# Patient Record
Sex: Female | Born: 2000 | Race: White | Hispanic: No | Marital: Married | Smoking: Never smoker
Health system: Southern US, Community
[De-identification: ages and names within clinical notes are randomized; demographics above are authoritative.]

## PROBLEM LIST (undated history)

## (undated) HISTORY — PX: ADENOIDECTOMY: SUR15

## (undated) HISTORY — PX: TONSILLECTOMY: SUR1361

---
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
## 2022-02-16 ENCOUNTER — Other Ambulatory Visit: Payer: Self-pay

## 2022-02-16 ENCOUNTER — Ambulatory Visit
Admission: EM | Admit: 2022-02-16 | Discharge: 2022-02-16 | Disposition: A | Discharge status: Home / Self Care | Payer: Medicaid Other | Attending: Emergency Medicine | Admitting: Emergency Medicine

## 2022-02-16 ENCOUNTER — Encounter: Payer: Self-pay | Admitting: Emergency Medicine

## 2022-02-16 ENCOUNTER — Ambulatory Visit: Admission: EM | Admit: 2022-02-16 | Discharge status: Absent without leave | Payer: Self-pay | Source: Home / Self Care

## 2022-02-16 DIAGNOSIS — B349 Viral infection, unspecified: Secondary | ICD-10-CM

## 2022-02-16 DIAGNOSIS — R112 Nausea with vomiting, unspecified: Secondary | ICD-10-CM

## 2022-02-16 MED ORDER — ONDANSETRON 4 MG PO TBDP: 4.0000 mg | ORAL_TABLET | Freq: Three times a day (TID) | ORAL | 0 refills | Status: DC | PRN

## 2022-02-16 NOTE — Discharge Instructions (Addendum)
Your COVID test is pending.  You should self quarantine until the test result is back.   ? ?Take Tylenol or ibuprofen as needed for fever or discomfort.   ? ?Take the antinausea medication as directed.  Keep yourself hydrated with clear liquids, such as water and Gatorade.   ? ?Follow up with your primary care provider if your symptoms are not improving.   ?

## 2022-02-16 NOTE — ED Triage Notes (Addendum)
Pt here with cough, sore throat, loss of taste and smell x 3 days.  ?

## 2022-02-16 NOTE — ED Provider Notes (Addendum)
?[facility] ? ? ? ?CSN: [id number] ?Arrival date & time: 02/16/22  1316 ? ? ?  ? ?History   ?Chief Complaint ?Chief Complaint  ?Patient presents with  ? Cough  ? Sore Throat  ? Loss of Taste and Smell  ? ? ?HPI ?Angela Johns is a 21 y.o. female.  Patient presents with 3 day history of congestion, runny nose, sore throat, cough, nausea, vomiting, diarrhea, loss of taste and smell.  Treatment at home with Mucinex.  She denies fever, rash, shortness of breath, or other symptoms.  No pertinent medical history.  ? ?The history is provided by the patient.  ? ?History reviewed. No pertinent past medical history. ? ?There are no problems to display for this patient. ? ? ?Past Surgical History:  ?Procedure Laterality Date  ? ADENOIDECTOMY    ? TONSILLECTOMY    ? ? ?OB History   ?No obstetric history on file. ?  ? ? ? ?Home Medications   ? ?Prior to Admission medications   ?Medication Sig Start Date End Date Taking? Authorizing Provider  ?ondansetron (ZOFRAN-ODT) 4 MG disintegrating tablet Take 1 tablet (4 mg total) by mouth every 8 (eight) hours as needed for nausea or vomiting. 02/16/22  Yes Mickie Bail, NP  ? ? ?Family History ?History reviewed. No pertinent family history. ? ?Social History ?Social History  ? ?Tobacco Use  ? Smoking status: Never  ? ? ? ?Allergies   ?Patient has no known allergies. ? ? ?Review of Systems ?Review of Systems  ?Constitutional:  Negative for chills and fever.  ?HENT:  Positive for congestion, rhinorrhea and sore throat. Negative for ear pain.   ?Respiratory:  Positive for cough. Negative for shortness of breath.   ?Cardiovascular:  Negative for chest pain and palpitations.  ?Gastrointestinal:  Positive for diarrhea, nausea and vomiting. Negative for abdominal pain.  ?Skin:  Negative for color change and rash.  ?All other systems reviewed and are negative. ? ? ?Physical Exam ?Triage Vital Signs ?ED Triage Vitals  ?Enc Vitals Group  ?   BP   ?   Pulse   ?   Resp   ?   Temp   ?    Temp src   ?   SpO2   ?   Weight   ?   Height   ?   Head Circumference   ?   Peak Flow   ?   Pain Score   ?   Pain Loc   ?   Pain Edu?   ?   Excl. in GC?   ? ?No data found. ? ?Updated Vital Signs ?BP 124/85   Pulse (!) 109   Temp 98.8 ?F (37.1 ?C)   Resp 18   LMP 02/14/2022 (Approximate)   SpO2 97%  ? ?Visual Acuity ?Right Eye Distance:   ?Left Eye Distance:   ?Bilateral Distance:   ? ?Right Eye Near:   ?Left Eye Near:    ?Bilateral Near:    ? ?Physical Exam ?Vitals and nursing note reviewed.  ?Constitutional:   ?   General: She is not in acute distress. ?   Appearance: She is well-developed.  ?HENT:  ?   Right Ear: Tympanic membrane normal.  ?   Left Ear: Tympanic membrane normal.  ?   Nose: Congestion and rhinorrhea present.  ?   Mouth/Throat:  ?   Mouth: Mucous membranes are moist.  ?   Pharynx: Oropharynx is clear.  ?Cardiovascular:  ?   Rate  and Rhythm: Normal rate and regular rhythm.  ?   Heart sounds: Normal heart sounds.  ?Pulmonary:  ?   Effort: Pulmonary effort is normal. No respiratory distress.  ?   Breath sounds: Normal breath sounds.  ?Abdominal:  ?   General: Bowel sounds are normal.  ?   Palpations: Abdomen is soft.  ?   Tenderness: There is no abdominal tenderness. There is no guarding or rebound.  ?Musculoskeletal:  ?   Cervical back: Neck supple.  ?Skin: ?   General: Skin is warm and dry.  ?Neurological:  ?   Mental Status: She is alert.  ?Psychiatric:     ?   Mood and Affect: Mood normal.     ?   Behavior: Behavior normal.  ? ? ? ?UC Treatments / Results  ?Labs ?(all labs ordered are listed, but only abnormal results are displayed) ?Labs Reviewed  ?NOVEL CORONAVIRUS, NAA  ? ? ?EKG ? ? ?Radiology ?No results found. ? ?Procedures ?Procedures (including critical care time) ? ?Medications Ordered in UC ?Medications - No data to display ? ?Initial Impression / Assessment and Plan / UC Course  ?I have reviewed the triage vital signs and the nursing notes. ? ?Pertinent labs & imaging results that  were available during my care of the patient were reviewed by me and considered in my medical decision making (see chart for details). ? ?  ?Viral illness, nausea and vomiting.  COVID pending.  Instructed patient to self quarantine per CDC guidelines.  Treating nausea and vomiting with Zofran.  Discussed symptomatic treatment including Tylenol or ibuprofen, rest, hydration.  Instructed patient to follow up with PCP if symptoms are not improving.  Patient agrees to plan of care. ? ? ?Final Clinical Impressions(s) / UC Diagnoses  ? ?Final diagnoses:  ?Viral illness  ?Nausea and vomiting, unspecified vomiting type  ? ? ? ?Discharge Instructions   ? ?  ?Your COVID test is pending.  You should self quarantine until the test result is back.   ? ?Take Tylenol or ibuprofen as needed for fever or discomfort.   ? ?Take the antinausea medication as directed.  Keep yourself hydrated with clear liquids, such as water and Gatorade.   ? ?Follow up with your primary care provider if your symptoms are not improving.   ? ? ? ? ?ED Prescriptions   ? ? Medication Sig Dispense Auth. Provider  ? ondansetron (ZOFRAN-ODT) 4 MG disintegrating tablet Take 1 tablet (4 mg total) by mouth every 8 (eight) hours as needed for nausea or vomiting. 20 tablet Mickie Bail, NP  ? ?  ? ?PDMP not reviewed this encounter. ?  ?Mickie Bail, NP ?02/16/22 1353 ? ?  ?Mickie Bail, NP ?02/16/22 1354 ? ?

## 2022-02-17 LAB — NOVEL CORONAVIRUS, NAA: SARS-CoV-2, NAA: NOT DETECTED

## 2024-07-07 ENCOUNTER — Encounter: Admitting: Obstetrics and Gynecology

## 2024-10-22 ENCOUNTER — Ambulatory Visit (HOSPITAL_COMMUNITY): Admission: EM | Admit: 2024-10-22 | Discharge: 2024-10-22 | Disposition: A | Discharge status: Home / Self Care

## 2024-10-22 ENCOUNTER — Ambulatory Visit (INDEPENDENT_AMBULATORY_CARE_PROVIDER_SITE_OTHER)

## 2024-10-22 ENCOUNTER — Encounter (HOSPITAL_COMMUNITY): Payer: Self-pay | Admitting: *Deleted

## 2024-10-22 DIAGNOSIS — M25562 Pain in left knee: Secondary | ICD-10-CM

## 2024-10-22 MED ORDER — IBUPROFEN 800 MG PO TABS: 800.0000 mg | ORAL_TABLET | Freq: Three times a day (TID) | ORAL | 0 refills | Status: AC

## 2024-10-22 NOTE — ED Triage Notes (Signed)
 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 States continues with painful ambulation. Has tried a knee brace, but didn't have right size. LLE CMS intact.

## 2024-10-22 NOTE — Discharge Instructions (Signed)
 Your x-ray is negative for any underlying fracture or dislocation.  As discussed it is likely that you had possibly injured a ligament in your knee. We have provided you with a knee brace to wear as needed for comfort and to help stabilize the knee joint. You can take 800 mg of ibuprofen every 8 hours as needed for pain. You can take 650 mg of Tylenol every 6-8 hours as needed for breakthrough pain. Rest, ice, and elevate periodically throughout the day. Follow-up with [facility] if your pain continues for further evaluation.

## 2024-10-22 NOTE — ED Provider Notes (Signed)
 [facility]    CSN: [id number] Arrival date & time: 10/22/24  1701      History   Chief Complaint Chief Complaint  Patient presents with   Knee Injury    HPI Angela Johns is a 23 y.o. female.   Patient presents with persistent left knee pain after a fall that occurred approximately 2 weeks ago.  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  Patient now endorses anterior and posterior knee pain.  Patient states the pain is worse with walking.  Patient denies any other injuries from this fall.  Patient states that she has been wearing a knee brace that does not fit very well without relief.  The history is provided by the patient and medical records.    History reviewed. No pertinent past medical history.  There are no active problems to display for this patient.   Past Surgical History:  Procedure Laterality Date   ADENOIDECTOMY     TONSILLECTOMY      OB History   No obstetric history on file.      Home Medications    Prior to Admission medications   Medication Sig Start Date End Date Taking? Authorizing Provider  aspirin-acetaminophen-caffeine (EXCEDRIN MIGRAINE) 250-250-65 MG tablet Take by mouth every 6 (six) hours as needed for headache.   Yes [provider]  ibuprofen (ADVIL) 800 MG tablet Take 1 tablet (800 mg total) by mouth 3 (three) times daily. 10/22/24  Yes Johnie Rumaldo LABOR, NP    Family History History reviewed. No pertinent family history.  Social History Social History   Tobacco Use   Smoking status: Never   Smokeless tobacco: Never  Vaping Use   Vaping status: Every Day  Substance Use Topics   Alcohol use: Yes    Comment: occasionally   Drug use: Not Currently    Types: Marijuana    Comment: none x 2 yrs     Allergies   Patient has no known allergies.   Review of Systems Review of  Systems  Per HPI  Physical Exam Triage Vital Signs ED Triage Vitals  Encounter Vitals Group     BP 10/22/24 1749 112/78     Girls Systolic BP Percentile --      Girls Diastolic BP Percentile --      Boys Systolic BP Percentile --      Boys Diastolic BP Percentile --      Pulse Rate 10/22/24 1749 88     Resp 10/22/24 1749 16     Temp 10/22/24 1749 98.2 F (36.8 C)     Temp Source 10/22/24 1749 Oral     SpO2 10/22/24 1749 97 %     Weight --      Height --      Head Circumference --      Peak Flow --      Pain Score 10/22/24 1750 6     Pain Loc --      Pain Education --      Exclude from Growth Chart --    No data found.  Updated Vital Signs BP 112/78   Pulse 88   Temp 98.2 F (36.8 C) (Oral)   Resp 16   LMP 10/05/2024 (Exact Date)   SpO2 97%   Visual Acuity Right Eye Distance:   Left Eye Distance:  Bilateral Distance:    Right Eye Near:   Left Eye Near:    Bilateral Near:     Physical Exam Vitals and nursing note reviewed.  Constitutional:      General: She is awake. She is not in acute distress.    Appearance: Normal appearance. She is well-developed and well-groomed. She is not ill-appearing.  Musculoskeletal:     Left knee: Swelling present. Decreased range of motion. Tenderness present over the LCL, ACL and PCL.  Skin:    General: Skin is warm and dry.  Neurological:     Mental Status: She is alert.  Psychiatric:        Behavior: Behavior is cooperative.      UC Treatments / Results  Labs (all labs ordered are listed, but only abnormal results are displayed) Labs Reviewed - No data to display  EKG   Radiology DG Knee Complete 4 Views Left Result Date: 10/22/2024 CLINICAL DATA:  Left knee pain following fall. EXAM: LEFT KNEE - COMPLETE 4+ VIEW COMPARISON:  None Available. FINDINGS: No evidence of acute fracture or dislocation. A small joint effusion is present. No evidence of arthropathy or other focal bone abnormality. Soft tissues are  unremarkable. IMPRESSION: 1. No acute fracture or dislocation. 2. Small joint effusion. Electronically Signed   By: Leita Birmingham M.D.   On: 10/22/2024 18:23    Procedures Procedures (including critical care time)  Medications Ordered in UC Medications - No data to display  Initial Impression / Assessment and Plan / UC Course  I have reviewed the triage vital signs and the nursing notes.  Pertinent labs & imaging results that were available during my care of the patient were reviewed by me and considered in my medical decision making (see chart for details).     Patient is overall well-appearing.  Vitals are stable.  Knee x-ray ordered.  I independently interpreted these images and there is no acute osseous abnormality.  Radiology report confirms this.  Knee pain likely from underlying ligamentous injury.  Provided patient with knee brace.  Prescribed 800 mg ibuprofen as needed for pain.  Given orthopedic follow-up.  Discussed follow-up and return precautions. Final Clinical Impressions(s) / UC Diagnoses   Final diagnoses:  Acute pain of left knee     Discharge Instructions      Your x-ray is negative for any underlying fracture or dislocation.  As discussed it is likely that you had possibly injured a ligament in your knee. We have provided you with a knee brace to wear as needed for comfort and to help stabilize the knee joint. You can take 800 mg of ibuprofen every 8 hours as needed for pain. You can take 650 mg of Tylenol every 6-8 hours as needed for breakthrough pain. Rest, ice, and elevate periodically throughout the day. Follow-up with [facility] if your pain continues for further evaluation.     ED Prescriptions     Medication Sig Dispense Auth. Provider   ibuprofen (ADVIL) 800 MG tablet Take 1 tablet (800 mg total) by mouth 3 (three) times daily. 21 tablet Johnie Flaming A, NP      PDMP not reviewed this encounter.   Johnie Flaming A,  NP 10/22/24 [phone]

## 2024-11-26 ENCOUNTER — Ambulatory Visit (HOSPITAL_COMMUNITY): Admission: EM | Admit: 2024-11-26 | Discharge: 2024-11-26 | Disposition: A | Discharge status: Home / Self Care

## 2024-11-26 DIAGNOSIS — J029 Acute pharyngitis, unspecified: Secondary | ICD-10-CM

## 2024-11-26 DIAGNOSIS — R350 Frequency of micturition: Secondary | ICD-10-CM

## 2024-11-26 LAB — POCT URINE DIPSTICK
Bilirubin, UA: NEGATIVE
Glucose, UA: NEGATIVE mg/dL
Leukocytes, UA: NEGATIVE
Nitrite, UA: NEGATIVE
POC PROTEIN,UA: NEGATIVE
Spec Grav, UA: 1.015
Urobilinogen, UA: 0.2 U/dL
pH, UA: 5.5

## 2024-11-26 LAB — POC COVID19/FLU A&B COMBO
Covid Antigen, POC: NEGATIVE
Influenza A Antigen, POC: NEGATIVE
Influenza B Antigen, POC: NEGATIVE

## 2024-11-26 LAB — POCT RAPID STREP A (OFFICE): Rapid Strep A Screen: NEGATIVE

## 2024-11-26 MED ORDER — ACETAMINOPHEN 325 MG PO TABS
ORAL_TABLET | ORAL | Status: AC
  Filled 2024-11-26: qty 2

## 2024-11-26 MED ORDER — PROMETHAZINE-DM 6.25-15 MG/5ML PO SYRP: 5.0000 mL | ORAL_SOLUTION | Freq: Every evening | ORAL | 0 refills | Status: AC | PRN

## 2024-11-26 MED ORDER — ACETAMINOPHEN 325 MG PO TABS
650.0000 mg | ORAL_TABLET | Freq: Once | ORAL | Status: AC
  Administered 2024-11-26: 650 mg via ORAL

## 2024-11-26 MED ORDER — ALBUTEROL SULFATE HFA 108 (90 BASE) MCG/ACT IN AERS
INHALATION_SPRAY | RESPIRATORY_TRACT | Status: AC
  Filled 2024-11-26: qty 6.7

## 2024-11-26 MED ORDER — ALBUTEROL SULFATE HFA 108 (90 BASE) MCG/ACT IN AERS
2.0000 | INHALATION_SPRAY | Freq: Once | RESPIRATORY_TRACT | Status: AC
  Administered 2024-11-26: 2 via RESPIRATORY_TRACT

## 2024-11-26 MED ORDER — ONDANSETRON 4 MG PO TBDP: 4.0000 mg | ORAL_TABLET | Freq: Three times a day (TID) | ORAL | 0 refills | Status: AC | PRN

## 2024-11-26 NOTE — ED Provider Notes (Signed)
 " [facility]    CSN: [id number] Arrival date & time: 11/26/24  1804      History   Chief Complaint No chief complaint on file.   HPI Angela Johns is a 23 y.o. female.   This 23 year old female is being seen for acute onset of fever, chills, headache, sore throat, cough, nausea, vomiting.  She reports symptom onset yesterday.  She reports decreased appetite.  She says her lungs hurt from coughing.  She vapes daily.  She also reports urinary frequency.  She took DayQuil at 10:00 this morning.  No improvement in symptoms.  She reports housemates have had head colds, however no one has been sick with fever and chills.  She denies dizziness.  She denies chest pain.  She denies abdominal pain, flank pain.     No past medical history on file.  There are no active problems to display for this patient.   Past Surgical History:  Procedure Laterality Date   ADENOIDECTOMY     TONSILLECTOMY      OB History   No obstetric history on file.      Home Medications    Prior to Admission medications  Medication Sig Start Date End Date Taking? Authorizing Provider  aspirin-acetaminophen -caffeine (EXCEDRIN MIGRAINE) 250-250-65 MG tablet Take by mouth every 6 (six) hours as needed for headache.   Yes [provider]  ondansetron  (ZOFRAN -ODT) 4 MG disintegrating tablet Take 1 tablet (4 mg total) by mouth every 8 (eight) hours as needed for nausea or vomiting. 11/26/24  Yes Lennice Jon BROCKS, FNP  promethazine -dextromethorphan (PROMETHAZINE -DM) 6.25-15 MG/5ML syrup Take 5 mLs by mouth at bedtime as needed for cough. 11/26/24  Yes Rajendra Spiller C, FNP  ibuprofen  (ADVIL ) 800 MG tablet Take 1 tablet (800 mg total) by mouth 3 (three) times daily. 10/22/24   Johnie Rumaldo LABOR, NP    Family History No family history on file.  Social History Social History[1]   Allergies   Patient has no known allergies.   Review of Systems Review of Systems  Constitutional:   Positive for activity change, appetite change, chills and fever.  HENT:  Positive for congestion and sore throat. Negative for ear pain, sinus pressure, sinus pain, sneezing, trouble swallowing and voice change.   Eyes:  Negative for visual disturbance.  Respiratory:  Positive for cough and wheezing. Negative for shortness of breath.   Cardiovascular:  Negative for chest pain.  Gastrointestinal:  Positive for nausea and vomiting. Negative for abdominal pain and diarrhea.  Genitourinary:  Positive for frequency. Negative for difficulty urinating and urgency.  Musculoskeletal:  Positive for arthralgias and myalgias.  Skin:  Negative for color change and rash.  Neurological:  Positive for headaches. Negative for dizziness.  All other systems reviewed and are negative.    Physical Exam Triage Vital Signs ED Triage Vitals  Encounter Vitals Group     BP 11/26/24 1848 133/84     Girls Systolic BP Percentile --      Girls Diastolic BP Percentile --      Boys Systolic BP Percentile --      Boys Diastolic BP Percentile --      Pulse Rate 11/26/24 1848 (!) 111     Resp 11/26/24 1848 18     Temp 11/26/24 1848 (!) 100.4 F (38 C)     Temp Source 11/26/24 1848 Oral     SpO2 11/26/24 1848 96 %     Weight --      Height --  Head Circumference --      Peak Flow --      Pain Score 11/26/24 1853 0     Pain Loc --      Pain Education --      Exclude from Growth Chart --    No data found.  Updated Vital Signs BP 133/84 (BP Location: Left Arm)   Pulse (!) 111   Temp (!) 100.4 F (38 C) (Oral)   Resp 18   LMP 11/16/2024 (Approximate)   SpO2 96%   Visual Acuity Right Eye Distance:   Left Eye Distance:   Bilateral Distance:    Right Eye Near:   Left Eye Near:    Bilateral Near:     Physical Exam Vitals and nursing note reviewed.  Constitutional:      General: She is awake. She is not in acute distress.    Appearance: She is well-developed. She is ill-appearing. She is not  toxic-appearing.     Comments: Pleasant female appearing stated age found sitting in chair in no acute distress.  HENT:     Head: Normocephalic and atraumatic.     Right Ear: Tympanic membrane and external ear normal.     Left Ear: Tympanic membrane and external ear normal.     Nose: Congestion and rhinorrhea present.     Mouth/Throat:     Lips: Pink.     Mouth: Mucous membranes are moist.     Pharynx: Posterior oropharyngeal erythema present. No oropharyngeal exudate.  Eyes:     Conjunctiva/sclera: Conjunctivae normal.  Cardiovascular:     Rate and Rhythm: Normal rate and regular rhythm.     Heart sounds: Normal heart sounds. No murmur heard. Pulmonary:     Effort: Pulmonary effort is normal. No respiratory distress.     Breath sounds: Examination of the right-lower field reveals wheezing. Examination of the left-lower field reveals wheezing. Wheezing present.  Abdominal:     General: Bowel sounds are normal.     Palpations: Abdomen is soft.     Tenderness: There is no abdominal tenderness.  Musculoskeletal:     Cervical back: Neck supple.  Lymphadenopathy:     Cervical: Cervical adenopathy present.  Skin:    General: Skin is warm and dry.     Capillary Refill: Capillary refill takes less than 2 seconds.  Neurological:     Mental Status: She is alert.  Psychiatric:        Mood and Affect: Mood normal.        Behavior: Behavior is cooperative.      UC Treatments / Results  Labs (all labs ordered are listed, but only abnormal results are displayed) Labs Reviewed  POCT URINE DIPSTICK - Abnormal; Notable for the following components:      Result Value   Ketones, POC UA trace (5) (*)    Blood, UA moderate (*)    All other components within normal limits  POCT RAPID STREP A (OFFICE) - Normal  POC COVID19/FLU A&B COMBO    EKG   Radiology No results found.  Procedures Procedures (including critical care time)  Medications Ordered in UC Medications   acetaminophen  (TYLENOL ) tablet 650 mg (650 mg Oral Given 11/26/24 1937)  albuterol  (VENTOLIN  HFA) 108 (90 Base) MCG/ACT inhaler 2 puff (2 puffs Inhalation Given 11/26/24 1937)    Initial Impression / Assessment and Plan / UC Course  I have reviewed the triage vital signs and the nursing notes.  Pertinent labs & imaging results that were  available during my care of the patient were reviewed by me and considered in my medical decision making (see chart for details).     Vitals and triage reviewed, patient is hemodynamically stable.  COVID/flu swab negative.  Strep swab negative.  UA is negative.  She is given acetaminophen  and albuterol  inhaler in clinic.  Likely viral respiratory illness.  Prescribed promethazine -DM cough syrup.  Advised Tylenol  and/or ibuprofen , guaifenesin, saline nasal spray.  Advised honey and water and salt water gargles.  Plan of care, follow-up care, return precautions given, no questions at this time. Final Clinical Impressions(s) / UC Diagnoses   Final diagnoses:  Sore throat  Urinary frequency     Discharge Instructions      You have a viral illness which will improve on its own with rest, fluids, and medications to help with your symptoms.  You have been prescribed ondansetron .  Take 1 tablet every 8 hours as needed for nausea, vomiting.   You have been prescribed promethazine -dextromethorphan cough syrup.  Take this at bedtime as needed for cough.  This medication can cause drowsiness.  Please do not drive or operate machinery while taking this medication.    Tylenol , guaifenesin (plain mucinex), and saline nasal sprays may help relieve symptoms.   Two teaspoons of honey in 1 cup of warm water every 4-6 hours may help with throat pains.  Salt water gargles, 1/2-1 teaspoon salt mixed in 1 cup of warm water, gargle and spit several times daily for sore throat.  Humidifier in room at nighttime may help soothe cough (clean well daily).   Take Promethazine   DM cough medication to help with your cough at nighttime so that you are able to sleep. Do not drive, drink alcohol, or go to work while taking this medication since it can make you sleepy. Only take this at nighttime.   For chest pain, shortness of breath, inability to keep food or fluids down without vomiting, fever that does not respond to tylenol  or motrin , or any other severe symptoms, please go to the ER for further evaluation. Return to urgent care as needed, otherwise follow-up with PCP.       ED Prescriptions     Medication Sig Dispense Auth. Provider   promethazine -dextromethorphan (PROMETHAZINE -DM) 6.25-15 MG/5ML syrup Take 5 mLs by mouth at bedtime as needed for cough. 118 mL Shenequa Howse C, FNP   ondansetron  (ZOFRAN -ODT) 4 MG disintegrating tablet Take 1 tablet (4 mg total) by mouth every 8 (eight) hours as needed for nausea or vomiting. 20 tablet Natausha Jungwirth C, FNP      PDMP not reviewed this encounter.    [1]  Social History Tobacco Use   Smoking status: Never   Smokeless tobacco: Never  Vaping Use   Vaping status: Every Day  Substance Use Topics   Alcohol use: Yes    Comment: occasionally   Drug use: Not Currently    Types: Marijuana    Comment: none x 2 yrs     Lennice Jon BROCKS, FNP 11/26/24 2005  "

## 2024-11-26 NOTE — Discharge Instructions (Addendum)
 You have a viral illness which will improve on its own with rest, fluids, and medications to help with your symptoms.  You have been prescribed ondansetron .  Take 1 tablet every 8 hours as needed for nausea, vomiting.   You have been prescribed promethazine -dextromethorphan cough syrup.  Take this at bedtime as needed for cough.  This medication can cause drowsiness.  Please do not drive or operate machinery while taking this medication.    Tylenol , guaifenesin (plain mucinex), and saline nasal sprays may help relieve symptoms.   Two teaspoons of honey in 1 cup of warm water every 4-6 hours may help with throat pains.  Salt water gargles, 1/2-1 teaspoon salt mixed in 1 cup of warm water, gargle and spit several times daily for sore throat.  Humidifier in room at nighttime may help soothe cough (clean well daily).   Take Promethazine  DM cough medication to help with your cough at nighttime so that you are able to sleep. Do not drive, drink alcohol, or go to work while taking this medication since it can make you sleepy. Only take this at nighttime.   For chest pain, shortness of breath, inability to keep food or fluids down without vomiting, fever that does not respond to tylenol  or motrin , or any other severe symptoms, please go to the ER for further evaluation. Return to urgent care as needed, otherwise follow-up with PCP.

## 2024-11-26 NOTE — ED Triage Notes (Signed)
 Patient reports sore throat,nausea and cough x 2 days.
